# Patient Record
Sex: Female | Born: 1995 | Race: Black or African American | Hispanic: No | Marital: Single | Smoking: Never smoker
Health system: Southern US, Community
[De-identification: ages and names within clinical notes are randomized; demographics above are authoritative.]

## PROBLEM LIST (undated history)

## (undated) HISTORY — PX: HERNIA REPAIR: SHX51

---
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
## 2015-11-02 HISTORY — PX: CHOLECYSTECTOMY: SHX55

## 2018-08-01 ENCOUNTER — Encounter (HOSPITAL_COMMUNITY): Payer: Self-pay

## 2018-08-01 ENCOUNTER — Other Ambulatory Visit: Payer: Self-pay

## 2018-08-01 ENCOUNTER — Ambulatory Visit (HOSPITAL_COMMUNITY)
Admission: EM | Admit: 2018-08-01 | Discharge: 2018-08-01 | Disposition: A | Discharge status: Home / Self Care | Payer: Self-pay | Attending: Physician Assistant | Admitting: Physician Assistant

## 2018-08-01 ENCOUNTER — Telehealth (HOSPITAL_COMMUNITY): Payer: Self-pay | Admitting: Emergency Medicine

## 2018-08-01 DIAGNOSIS — J039 Acute tonsillitis, unspecified: Secondary | ICD-10-CM

## 2018-08-01 DIAGNOSIS — K122 Cellulitis and abscess of mouth: Secondary | ICD-10-CM

## 2018-08-01 LAB — CBC WITH DIFFERENTIAL/PLATELET
Abs Immature Granulocytes: 0.04 10*3/uL (ref 0.00–0.07)
Basophils Absolute: 0.1 10*3/uL (ref 0.0–0.1)
Basophils Relative: 1 %
Eosinophils Absolute: 0.3 10*3/uL (ref 0.0–0.5)
Eosinophils Relative: 2 %
HCT: 38.1 % (ref 36.0–46.0)
Hemoglobin: 12.1 g/dL (ref 12.0–15.0)
Immature Granulocytes: 0 %
Lymphocytes Relative: 15 %
Lymphs Abs: 1.7 10*3/uL (ref 0.7–4.0)
MCH: 27.8 pg (ref 26.0–34.0)
MCHC: 31.8 g/dL (ref 30.0–36.0)
MCV: 87.6 fL (ref 80.0–100.0)
Monocytes Absolute: 1 10*3/uL (ref 0.1–1.0)
Monocytes Relative: 9 %
Neutro Abs: 8 10*3/uL — ABNORMAL HIGH (ref 1.7–7.7)
Neutrophils Relative %: 73 %
Platelets: 365 10*3/uL (ref 150–400)
RBC: 4.35 MIL/uL (ref 3.87–5.11)
RDW: 12.7 % (ref 11.5–15.5)
WBC: 11 10*3/uL — ABNORMAL HIGH (ref 4.0–10.5)
nRBC: 0 % (ref 0.0–0.2)

## 2018-08-01 LAB — POCT URINALYSIS DIP (DEVICE)
Glucose, UA: NEGATIVE mg/dL
Hgb urine dipstick: NEGATIVE
Ketones, ur: 80 mg/dL — AB
Leukocytes,Ua: NEGATIVE
Nitrite: NEGATIVE
Protein, ur: 100 mg/dL — AB
Specific Gravity, Urine: >=1.03 (ref 1.005–1.030)
Urobilinogen, UA: 2 mg/dL — ABNORMAL HIGH (ref 0.0–1.0)
pH: 6.5 (ref 5.0–8.0)

## 2018-08-01 LAB — COMPREHENSIVE METABOLIC PANEL
ALT: 19 U/L (ref 0–44)
AST: 24 U/L (ref 15–41)
Albumin: 3.5 g/dL (ref 3.5–5.0)
Alkaline Phosphatase: 80 U/L (ref 38–126)
Anion gap: 13 (ref 5–15)
BUN: <5 mg/dL — ABNORMAL LOW (ref 6–20)
CO2: 28 mmol/L (ref 22–32)
Calcium: 9.5 mg/dL (ref 8.9–10.3)
Chloride: 102 mmol/L (ref 98–111)
Creatinine, Ser: 0.78 mg/dL (ref 0.44–1.00)
GFR calc Af Amer: >60 mL/min (ref >60)
GFR calc non Af Amer: >60 mL/min (ref >60)
Glucose, Bld: 81 mg/dL (ref 70–99)
Potassium: 2.8 mmol/L — ABNORMAL LOW (ref 3.5–5.1)
Sodium: 143 mmol/L (ref 135–145)
Total Bilirubin: 0.6 mg/dL (ref 0.3–1.2)
Total Protein: 8.2 g/dL — ABNORMAL HIGH (ref 6.5–8.1)

## 2018-08-01 LAB — C-REACTIVE PROTEIN: CRP: 11.8 mg/dL — ABNORMAL HIGH (ref <1.0)

## 2018-08-01 LAB — POCT INFECTIOUS MONO SCREEN: Mono Screen: NEGATIVE

## 2018-08-01 LAB — POCT RAPID STREP A: Streptococcus, Group A Screen (Direct): NEGATIVE

## 2018-08-01 MED ORDER — LIDOCAINE VISCOUS HCL 2 % MT SOLN: OROMUCOSAL | 0 refills | Status: AC

## 2018-08-01 MED ORDER — DEXAMETHASONE SODIUM PHOSPHATE 10 MG/ML IJ SOLN
INTRAMUSCULAR | Status: AC
  Filled 2018-08-01: qty 1

## 2018-08-01 MED ORDER — DEXAMETHASONE SODIUM PHOSPHATE 10 MG/ML IJ SOLN
10.0000 mg | Freq: Once | INTRAMUSCULAR | Status: AC
  Administered 2018-08-01: 10 mg via INTRAMUSCULAR

## 2018-08-01 MED ORDER — POTASSIUM CHLORIDE ER 10 MEQ PO TBCR: 10.0000 meq | EXTENDED_RELEASE_TABLET | Freq: Two times a day (BID) | ORAL | 0 refills | Status: AC

## 2018-08-01 NOTE — Telephone Encounter (Signed)
Potassium low, per Amy PA to call in prescirption for potassium, also CRP elevated, patient needs follow up with ID per provider, given information. Pt agreeable to plan, all questions answered.

## 2018-08-01 NOTE — ED Triage Notes (Signed)
Patient presents to Urgent Care with complaints of swollen area near tonsils on left side since about a month ago. Patient states she just moved to the area and before moving, was given abx (which she just finished) but the swelling has not improved. Pt states she can swallow but it is painful, airway intact, secretions controlled.

## 2018-08-01 NOTE — ED Provider Notes (Addendum)
[facility]    CSN: [id number] Arrival date & time: 08/01/18  6160     History   Chief Complaint Chief Complaint  Patient presents with  . Sore Throat    HPI Cathy Norman is a 22 y.o. female.   23 year old female comes in for 1 month history of left sided swelling to the neck.  States she recently moved from [facility] to [place].  Prior to moving, she was given clindamycin for the swelling, but it has not helped.  Her last dose of clindamycin was 07/24/2018.  She denies sore throat at baseline, but has painful swallowing.  She denies trouble breathing, tripoding, drooling, trismus.  She has had changes of voice.  Denies rhinorrhea, nasal congestion, cough.  Denies fever, chills, night sweats.  Denies spreading erythema, warmth.  States recently has developed mild left ear pain without changes in hearing, drainage.     History reviewed. No pertinent past medical history.  There are no active problems to display for this patient.   Past Surgical History:  Procedure Laterality Date  . CHOLECYSTECTOMY  11/2015  . HERNIA REPAIR      OB History   No obstetric history on file.      Home Medications    Prior to Admission medications   Medication Sig Start Date End Date Taking? Authorizing Provider  lidocaine (XYLOCAINE) 2 % solution 5-15 mL gurgle as needed 08/01/18   Cathie Hoops, Amy V, PA-C  potassium chloride (K-DUR) 10 MEQ tablet Take 1 tablet (10 mEq total) by mouth 2 (two) times daily for 14 days. 08/01/18 08/15/18  Belinda Fisher, PA-C    Family History Family History  Problem Relation Age of Onset  . Hypertension Mother     Social History Social History   Tobacco Use  . Smoking status: Never Smoker  . Smokeless tobacco: Never Used  Substance Use Topics  . Alcohol use: Never    Frequency: Never  . Drug use: Never     Allergies   Patient has no known allergies.   Review of Systems Review of Systems  Reason unable to perform ROS: See  HPI as above.     Physical Exam Triage Vital Signs ED Triage Vitals  Enc Vitals Group     BP 08/01/18 0912 (!) 134/91     Pulse Rate 08/01/18 0912 98     Resp 08/01/18 0912 18     Temp 08/01/18 0912 98.4 F (36.9 C)     Temp Source 08/01/18 0912 Oral     SpO2 08/01/18 0912 100 %     Weight --      Height --      Head Circumference --      Peak Flow --      Pain Score 08/01/18 0908 2     Pain Loc --      Pain Edu? --      Excl. in GC? --    No data found.  Updated Vital Signs BP (!) 134/91 (BP Location: Right Arm)   Pulse 98   Temp 98.4 F (36.9 C) (Oral)   Resp 18   LMP 07/19/2018 (Exact Date)   SpO2 100%   Physical Exam Constitutional:      General: She is not in acute distress.    Appearance: She is well-developed. She is not ill-appearing, toxic-appearing or diaphoretic.  HENT:     Head: Normocephalic and atraumatic.     Right Ear: Tympanic membrane, ear canal  and external ear normal. Tympanic membrane is not erythematous or bulging.     Left Ear: Tympanic membrane, ear canal and external ear normal. Tympanic membrane is not erythematous or bulging.     Nose: Nose normal.     Right Sinus: No maxillary sinus tenderness or frontal sinus tenderness.     Left Sinus: No maxillary sinus tenderness or frontal sinus tenderness.     Mouth/Throat:     Mouth: Mucous membranes are moist.     Pharynx: Oropharynx is clear.     Tonsils: 3+ on the right. 3+ on the left.     Comments: Muffled voice. However, tonsils equal bilaterally with uvula midline. With uvula swelling. Handling own secretions well.  Eyes:     Conjunctiva/sclera: Conjunctivae normal.     Pupils: Pupils are equal, round, and reactive to light.  Neck:     Musculoskeletal: Normal range of motion and neck supple.  Cardiovascular:     Rate and Rhythm: Normal rate and regular rhythm.     Heart sounds: Normal heart sounds. No murmur. No friction rub. No gallop.   Pulmonary:     Effort: Pulmonary effort is  normal. No accessory muscle usage, prolonged expiration, respiratory distress or retractions.     Breath sounds: Normal breath sounds. No stridor, decreased air movement or transmitted upper airway sounds. No decreased breath sounds, wheezing, rhonchi or rales.  Lymphadenopathy:     Head:     Right side of head: Tonsillar adenopathy present.     Left side of head: Tonsillar adenopathy present.     Cervical: Cervical adenopathy present.  Skin:    General: Skin is warm and dry.  Neurological:     Mental Status: She is alert and oriented to person, place, and time.      UC Treatments / Results  Labs (all labs ordered are listed, but only abnormal results are displayed) Labs Reviewed  CBC WITH DIFFERENTIAL/PLATELET - Abnormal; Notable for the following components:      Result Value   WBC 11.0 (*)    Neutro Abs 8.0 (*)    All other components within normal limits  COMPREHENSIVE METABOLIC PANEL - Abnormal; Notable for the following components:   Potassium 2.8 (*)    BUN <5 (*)    Total Protein 8.2 (*)    All other components within normal limits  C-REACTIVE PROTEIN - Abnormal; Notable for the following components:   CRP 11.8 (*)    All other components within normal limits  POCT URINALYSIS DIP (DEVICE) - Abnormal; Notable for the following components:   Bilirubin Urine MODERATE (*)    Ketones, ur 80 (*)    Protein, ur 100 (*)    Urobilinogen, UA 2.0 (*)    All other components within normal limits  CULTURE, GROUP A STREP [facility]  POCT RAPID STREP A  POCT INFECTIOUS MONO SCREEN  POCT INFECTIOUS MONO SCREEN    EKG None  Radiology No results found.  Procedures Procedures (including critical care time)  Medications Ordered in UC Medications  dexamethasone (DECADRON) injection 10 mg (10 mg Intramuscular Given 08/01/18 1023)    Initial Impression / Assessment and Plan / UC Course  I have reviewed the triage vital signs and the nursing notes.  Pertinent labs & imaging  results that were available during my care of the patient were reviewed by me and considered in my medical decision making (see chart for details).    23 year old female comes in with 1  month history of lymphadenopathy, tonsillar and uvula swelling. Rapid strep negative, no improvement with clindamycin. Discussed case with Dr Milus Glazier, who recommended mono, UA, CBC, CMP, CRP. Mono negative. Discussed dipstick results with patient, possible dehydration causing results, however will have CMP results to assess for kidney function and liver function.   Discussed possible viral etiology, however no improvement after 1 month. Mono negative. Given current COVID pandemic, may need to consider atypical presentation of COVID. Will provide symptomatic treatment and await lab results. Patient given decadron injection of tonsillar and uvula swelling. Will discharge in stable condition pending lab results.  CRP elevated. CMP shows normal liver and kidney function. Does show hypokalemia. Will call in potassium BID x 2 weeks. Discussed lab results with Dr Milus Glazier, discussed possible viral illness causing symptoms. However, given atypical presentation with symptoms lasting 1 month, Dr Milus Glazier suggested follow up with ID for further evaluation.   Case discussed with Dr Milus Glazier, who agrees to plan.  Final Clinical Impressions(s) / UC Diagnoses   Final diagnoses:  Uvulitis  Tonsillitis   ED Prescriptions    Medication Sig Dispense Auth. Provider   lidocaine (XYLOCAINE) 2 % solution 5-15 mL gurgle as needed 150 mL Idamae Lusher 08/01/18 1650    Belinda Fisher, PA-C 08/01/18 1650

## 2018-08-01 NOTE — Discharge Instructions (Addendum)
Decadron injection in office today. Start lidocaine for sore throat, do not eat or drink for the next 40 mins after use as it can stunt your gag reflex. Keep hydrated, urine should be clear to pale yellow in color. Monitor for any worsening of symptoms, swelling of the throat, trouble breathing, trouble swallowing, leaning forward to breath, drooling, go to the emergency department for further evaluation needed. Otherwise, we will contact you tomorrow for lab results.

## 2018-08-03 LAB — CULTURE, GROUP A STREP (THRC)

## 2018-08-05 ENCOUNTER — Telehealth (HOSPITAL_COMMUNITY): Payer: Self-pay | Admitting: Emergency Medicine

## 2018-08-05 NOTE — Telephone Encounter (Signed)
Attempted to reach patient. No answer at this time. No voicemail available.

## 2018-08-05 NOTE — Telephone Encounter (Signed)
Patient contacted, states she feels completely better. NO treatment needed.

## 2018-09-11 ENCOUNTER — Telehealth: Payer: Self-pay

## 2018-09-11 NOTE — Telephone Encounter (Signed)
Attempted to return call twice, number rings busy

## 2018-09-23 ENCOUNTER — Telehealth: Payer: Self-pay | Admitting: Advanced Practice Midwife

## 2018-09-23 NOTE — Telephone Encounter (Signed)
The patient called in stating the staff at the [facility] office was rude to her when called in and she doesn't think they are the people for her. She stated she is calling around for other options. She also stated she confirmed pregnancy via an at home test. While reviewing her chart, I placed the patient on hold. She disconnected the call. A call attempt was made. Received a message you call can not be completed at this time.

## 2018-09-27 ENCOUNTER — Other Ambulatory Visit (HOSPITAL_COMMUNITY)
Admission: RE | Admit: 2018-09-27 | Discharge: 2018-09-27 | Disposition: A | Discharge status: Home / Self Care | Payer: Medicaid Other | Source: Ambulatory Visit

## 2018-09-27 ENCOUNTER — Ambulatory Visit (INDEPENDENT_AMBULATORY_CARE_PROVIDER_SITE_OTHER): Payer: Medicaid Other

## 2018-09-27 ENCOUNTER — Other Ambulatory Visit: Payer: Self-pay

## 2018-09-27 VITALS — BP 130/84 | HR 93 | Ht 72.0 in | Wt 240.1 lb

## 2018-09-27 DIAGNOSIS — Z3490 Encounter for supervision of normal pregnancy, unspecified, unspecified trimester: Secondary | ICD-10-CM

## 2018-09-27 DIAGNOSIS — E669 Obesity, unspecified: Secondary | ICD-10-CM | POA: Insufficient documentation

## 2018-09-27 DIAGNOSIS — E66811 Obesity, class 1: Secondary | ICD-10-CM

## 2018-09-27 DIAGNOSIS — O09211 Supervision of pregnancy with history of pre-term labor, first trimester: Secondary | ICD-10-CM

## 2018-09-27 DIAGNOSIS — Z3491 Encounter for supervision of normal pregnancy, unspecified, first trimester: Secondary | ICD-10-CM | POA: Diagnosis not present

## 2018-09-27 DIAGNOSIS — Z3A10 10 weeks gestation of pregnancy: Secondary | ICD-10-CM

## 2018-09-27 MED ORDER — ASPIRIN EC 81 MG PO TBEC: 81.0000 mg | DELAYED_RELEASE_TABLET | Freq: Every day | ORAL | 3 refills | Status: AC

## 2018-09-27 NOTE — Progress Notes (Signed)
Subjective:   Cathy Norman is a 23 y.o. G2P1001 at [redacted]w[redacted]d by definite LMP being seen today for her first obstetrical visit.  Her obstetrical history is significant for obesity and vaginal delivery.  Patient does intend to breast feed and expresses a desire for a delivery without epidural anesthesia. She states her previous delivery was in [place], [place] and was uncomplicated. Pregnancy history fully reviewed.  Patient reports nausea, that is self-limiting and occurs primarily before 1pm.  She denies vaginal concerns including discharge, bleeding, leaking, odor, or limitation.  She requests genetic testing today.  HISTORY: OB History  Gravida Para Term Preterm AB Living  2 1 1  0 0 1  SAB TAB Ectopic Multiple Live Births  0 0 0 0 1    # Outcome Date GA Lbr Len/2nd Weight Sex Delivery Anes PTL Lv  2 Current           1 Term 06/23/15 [redacted]w[redacted]d  6 lb 10 oz (3.005 kg) M Vag-Spont   LIV    Last pap smear was done March 2017 and was normal per patient.  A pap smear was also collected today.   History reviewed. No pertinent past medical history. Past Surgical History:  Procedure Laterality Date  . CHOLECYSTECTOMY  11/2015  . HERNIA REPAIR     Family History  Problem Relation Age of Onset  . Hypertension Mother    Social History   Tobacco Use  . Smoking status: Never Smoker  . Smokeless tobacco: Never Used  Substance Use Topics  . Alcohol use: Never    Frequency: Never  . Drug use: Never   No Known Allergies Current Outpatient Medications on File Prior to Visit  Medication Sig Dispense Refill  . Prenatal Vit-Fe Fumarate-FA (PRENATAL VITAMINS) 28-0.8 MG TABS Take by mouth.    . lidocaine (XYLOCAINE) 2 % solution 5-15 mL gurgle as needed (Patient not taking: Reported on 09/27/2018) 150 mL 0  . potassium chloride (K-DUR) 10 MEQ tablet Take 1 tablet (10 mEq total) by mouth 2 (two) times daily for 14 days. 28 tablet 0   No current facility-administered medications on file prior to  visit.     Review of Systems Pertinent items noted in HPI and remainder of comprehensive ROS otherwise negative.  Exam   Vitals:   09/27/18 0820 09/27/18 0821  BP: 130/84   Pulse: 93   Weight: 240 lb 1.6 oz (108.9 kg)   Height:  6' (1.829 m)       Recommendations Valu.Nieves ] Aspirin 81 mg daily after 12 weeks; discontinue after 36 weeks [ ]  Nutrition consult Valu.Nieves ] Weight gain 11-20 lbs for singleton and 25-35 lbs for twin pregnancy (IOM guidelines) . Higher class of obesity patients recommended to gain closer to lower limit  . Weight loss is associated with adverse outcomes [ ]  Baseline and surveillance labs (pulled in from [facility], refresh links as needed)  Lab Results  Component Value Date   PLT 365 08/01/2018   CREATININE 0.78 08/01/2018   AST 24 08/01/2018   ALT 19 08/01/2018    Physical Exam Exam conducted with a chaperone present.  Constitutional:      Appearance: She is obese.  HENT:     Head: Normocephalic and atraumatic.     Mouth/Throat:     Mouth: Mucous membranes are moist.  Eyes:     Conjunctiva/sclera: Conjunctivae normal.  Neck:     Musculoskeletal: Normal range of motion.  Cardiovascular:  Rate and Rhythm: Regular rhythm. Tachycardia present.     Heart sounds: Normal heart sounds.  Pulmonary:     Effort: Pulmonary effort is normal.     Breath sounds: Normal breath sounds.  Abdominal:     General: Bowel sounds are normal. There is no distension.     Palpations: Abdomen is soft.     Tenderness: There is no abdominal tenderness.  Genitourinary:    Vagina: Vaginal discharge present. No bleeding.     Cervix: Discharge present.     Uterus: Enlarged.      Comments: Sterile Speculum Exam: -Vaginal Vault: Pink mucosa.  Moderate amt thin white discharge in vault -cervicovaginal swab -Cervix:Pink, no lesions, polyps, or cysts.  Appears closed. No active bleeding, but yellowish mucoid discharge noted from os -Bimanual Exam: Closed/ Uterus difficult to  assess, but enlarged Musculoskeletal: Normal range of motion.  Skin:    General: Skin is warm and dry.  Neurological:     Mental Status: She is alert and oriented to person, place, and time.  Psychiatric:        Mood and Affect: Mood normal.        Thought Content: Thought content normal.    168 Bedside Ultrasound for FHR check: Patient informed that the ultrasound is considered a limited obstetric ultrasound and is not intended to be a complete ultrasound exam.  Patient also informed that the ultrasound is not being completed with the intent of assessing for fetal or placental anomalies or any pelvic abnormalities.  Explained that the purpose of today's ultrasound is to assess for fetal heart rate.  Patient acknowledges the purpose of the exam and the limitations of the study.   Assessment:   Pregnancy: G2P1001 Patient Active Problem List   Diagnosis Date Noted  . Encounter for supervision of normal pregnancy, unspecified, unspecified trimester 09/27/2018     Plan:  1. Encounter for supervision of normal pregnancy, antepartum, unspecified gravidity -Congratulations given and patient welcomed to practice. -Discussed Babyscripts and Webex as primary source of PN vital documentation and visits in midst of coronavirus.   -Encouraged to seek out care at office or emergency room for urgent and/or emergent concerns. -Educated on the nature of [place] [facility] with multiple MDs and other Advanced Practice Providers was explained to patient; also emphasized that residents, students are part of our team. Informed of her right to refuse care as she deems appropriate.  -No questions or concerns.  -Anticipatory guidance for prenatal visits including labs, ultrasounds, and testing. -Encouraged to complete MyChart Registration for her ability to review results, send requests, and have questions addressed.  -Discussed due date based on Definite LMP. -Initiate prenatal  vitamins; Rx sent to pharmacy on file.  -Plans to breastfeed and unsure of PP BC method. -Desires circumcision if a female child.   2. Obesity (BMI 30-39.9) -Discussed initiation of aspirin at 12 weeks due to obesity. -Rx for aspirin sent to pharmacy on file with instructions to start July 13th  Initial labs drawn. Continue prenatal vitamins. Genetic Screening discussed, First trimester screen, Quad screen and NIPS: ordered. Ultrasound discussed; fetal anatomic survey: ordered. Problem list reviewed and updated.   Cherre RobinsJessica L Lourie Retz, CNM 09/27/2018 8:41 AM

## 2018-09-27 NOTE — Progress Notes (Signed)
Pt presents for Initial OB visit. Pt has no concerns today.

## 2018-09-28 LAB — OBSTETRIC PANEL, INCLUDING HIV
Antibody Screen: NEGATIVE
Basophils Absolute: 0.1 10*3/uL (ref 0.0–0.2)
Basos: 1 %
EOS (ABSOLUTE): 0.1 10*3/uL (ref 0.0–0.4)
Eos: 2 %
HIV Screen 4th Generation wRfx: NONREACTIVE
Hematocrit: 34.9 % (ref 34.0–46.6)
Hemoglobin: 11.4 g/dL (ref 11.1–15.9)
Hepatitis B Surface Ag: NEGATIVE
Immature Grans (Abs): 0 10*3/uL (ref 0.0–0.1)
Immature Granulocytes: 0 %
Lymphocytes Absolute: 1.8 10*3/uL (ref 0.7–3.1)
Lymphs: 36 %
MCH: 28.3 pg (ref 26.6–33.0)
MCHC: 32.7 g/dL (ref 31.5–35.7)
MCV: 87 fL (ref 79–97)
Monocytes Absolute: 0.6 10*3/uL (ref 0.1–0.9)
Monocytes: 11 %
Neutrophils Absolute: 2.5 10*3/uL (ref 1.4–7.0)
Neutrophils: 50 %
Platelets: 326 10*3/uL (ref 150–450)
RBC: 4.03 x10E6/uL (ref 3.77–5.28)
RDW: 13.3 % (ref 11.7–15.4)
RPR Ser Ql: NONREACTIVE
Rh Factor: POSITIVE
Rubella Antibodies, IGG: 1.74 index (ref >0.99)
WBC: 5.1 10*3/uL (ref 3.4–10.8)

## 2018-09-28 NOTE — Addendum Note (Signed)
Addended by: Gavin Pound L on: 09/28/2018 04:12 AM   Modules accepted: Orders

## 2018-09-29 LAB — URINE CULTURE, OB REFLEX: Organism ID, Bacteria: NO GROWTH

## 2018-09-29 LAB — CULTURE, OB URINE

## 2018-09-30 LAB — CYTOLOGY - PAP: Diagnosis: NEGATIVE

## 2018-10-01 LAB — CERVICOVAGINAL ANCILLARY ONLY
Bacterial vaginitis: POSITIVE — AB
Candida vaginitis: NEGATIVE
Chlamydia: POSITIVE — AB
Neisseria Gonorrhea: NEGATIVE
Trichomonas: POSITIVE — AB

## 2018-10-03 ENCOUNTER — Other Ambulatory Visit: Payer: Self-pay

## 2018-10-03 ENCOUNTER — Telehealth: Payer: Self-pay

## 2018-10-03 DIAGNOSIS — A5901 Trichomonal vulvovaginitis: Secondary | ICD-10-CM

## 2018-10-03 DIAGNOSIS — A749 Chlamydial infection, unspecified: Secondary | ICD-10-CM

## 2018-10-03 MED ORDER — METRONIDAZOLE 500 MG PO TABS: 2000.0000 mg | ORAL_TABLET | Freq: Once | ORAL | 0 refills | Status: AC

## 2018-10-03 MED ORDER — AZITHROMYCIN 500 MG PO TABS: 1000.0000 mg | ORAL_TABLET | Freq: Once | ORAL | 0 refills | Status: AC

## 2018-10-03 NOTE — Telephone Encounter (Signed)
10/03/2018 12:54 PM  Provider attempted to call patient x2 at number on file to discuss results.  No answer x 2 and voicemail unavailable.  Will send prescriptions to pharmacy on file and release results to mychart.  Will also send note to clinical staff so they may attempt to contact patient and inform.  Maryann Conners MSN, CNM 10/03/2018 12:56 PM

## 2018-10-03 NOTE — Telephone Encounter (Signed)
I am not able to reach the pt by phone.  I did send a detailed message through [place].

## 2018-10-25 ENCOUNTER — Encounter: Payer: Medicaid Other | Admitting: Obstetrics

## 2018-10-28 ENCOUNTER — Telehealth: Payer: Self-pay | Admitting: Obstetrics

## 2018-11-29 ENCOUNTER — Other Ambulatory Visit (HOSPITAL_COMMUNITY): Payer: Medicaid Other
